# Patient Record
Sex: Male | Born: 2005 | Hispanic: No | Marital: Single | Smoking: Never smoker
Health system: Southern US, Community
[De-identification: ages and names within clinical notes are randomized; demographics above are authoritative.]

## PROBLEM LIST (undated history)

## (undated) DIAGNOSIS — J302 Other seasonal allergic rhinitis: Secondary | ICD-10-CM

## (undated) DIAGNOSIS — J45909 Unspecified asthma, uncomplicated: Secondary | ICD-10-CM

## (undated) HISTORY — PX: ADENOIDECTOMY: SUR15

---
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
## 2023-10-26 ENCOUNTER — Other Ambulatory Visit: Payer: Self-pay

## 2023-10-26 ENCOUNTER — Emergency Department (HOSPITAL_COMMUNITY)
Admission: EM | Admit: 2023-10-26 | Discharge: 2023-10-27 | Disposition: A | Discharge status: Home / Self Care | Attending: Emergency Medicine | Admitting: Emergency Medicine

## 2023-10-26 DIAGNOSIS — E860 Dehydration: Secondary | ICD-10-CM | POA: Insufficient documentation

## 2023-10-26 DIAGNOSIS — R55 Syncope and collapse: Secondary | ICD-10-CM | POA: Insufficient documentation

## 2023-10-26 DIAGNOSIS — R112 Nausea with vomiting, unspecified: Secondary | ICD-10-CM | POA: Insufficient documentation

## 2023-10-26 DIAGNOSIS — R1110 Vomiting, unspecified: Secondary | ICD-10-CM | POA: Diagnosis present

## 2023-10-26 LAB — CBC
HCT: 44.1 % (ref 39.0–52.0)
Hemoglobin: 15.1 g/dL (ref 13.0–17.0)
MCH: 28.8 pg (ref 26.0–34.0)
MCHC: 34.2 g/dL (ref 30.0–36.0)
MCV: 84.2 fL (ref 80.0–100.0)
Platelets: 217 K/uL (ref 150–400)
RBC: 5.24 MIL/uL (ref 4.22–5.81)
RDW: 13.3 % (ref 11.5–15.5)
WBC: 11 K/uL — ABNORMAL HIGH (ref 4.0–10.5)
nRBC: 0 % (ref 0.0–0.2)

## 2023-10-26 LAB — CBG MONITORING, ED: Glucose-Capillary: 99 mg/dL (ref 70–99)

## 2023-10-26 MED ORDER — SODIUM CHLORIDE 0.9 % IV BOLUS
1000.0000 mL | Freq: Once | INTRAVENOUS | Status: AC
  Administered 2023-10-26: 1000 mL via INTRAVENOUS

## 2023-10-26 MED ORDER — ONDANSETRON HCL 4 MG/2ML IJ SOLN
4.0000 mg | Freq: Once | INTRAMUSCULAR | Status: AC | PRN
  Administered 2023-10-26: 4 mg via INTRAVENOUS
  Filled 2023-10-26: qty 2

## 2023-10-26 NOTE — ED Triage Notes (Addendum)
 Pt arrives with c/o sudden tingling in BLE, states he can't move his right hand, this began while he was at home getting ready for work, had just gotten home from the gym. When put in triage room, pt began vomiting, is clammy

## 2023-10-26 NOTE — ED Provider Notes (Signed)
 [facility] EMERGENCY DEPARTMENT AT [facility] Provider Note   CSN: [id number] Arrival date & time: 10/26/23  2245     Patient presents with: Emesis   Steven Reynolds is a 18 y.o. male.  {Add pertinent medical, surgical, social history, OB history to YEP:67052} The history is provided by the patient.  Emesis Steven Reynolds is a 18 y.o. male who presents to the Emergency Department complaining of *** Near syncope, fell to bed.  Went to gym and a few minutes after the gym 1030.  Getting ready for work. Did legs.  Whole body froze, now getting better. Felt numb over entire body.  No fever. Felt sob - tried to take inhaler.  No CP, AP, HA.  Vomited when they arrived.  Has nausea.  No dysuria.  Referred to heart doctor but never went.    No tobacco, rare alcohol. No drugs.   Family hx/o DM, HTN, HPL    Prior to Admission medications   Medication Sig Start Date End Date Taking? Authorizing Provider  acetaminophen  (TYLENOL ) 325 MG tablet Take 2 tablets (650 mg total) by mouth every 6 (six) hours as needed. 11/30/19   Katrinka Raisin, MD  benzonatate  (TESSALON ) 100 MG capsule Take 1 capsule (100 mg total) by mouth every 8 (eight) hours. 01/02/22   Levander Houston, MD  fluticasone  (FLONASE ) 50 MCG/ACT nasal spray Place 2 sprays into both nostrils daily. Patient not taking: Reported on 11/29/2019 06/20/15   Alexandra Erichsen, MD    Allergies: [place] cockroach    Review of Systems  Gastrointestinal:  Positive for vomiting.  All other systems reviewed and are negative.   Updated Vital Signs BP (!) 140/91   Pulse 99   Temp 98.6 F (37 C) (Oral)   Resp 20   SpO2 100%   Physical Exam Vitals and nursing note reviewed.  Constitutional:      Appearance: He is well-developed.  HENT:     Head: Normocephalic and atraumatic.  Cardiovascular:     Rate and Rhythm: Normal rate and regular rhythm.     Heart sounds: No murmur heard. Pulmonary:     Effort: Pulmonary effort is normal.  No respiratory distress.     Breath sounds: Normal breath sounds.  Abdominal:     Palpations: Abdomen is soft.     Tenderness: There is no abdominal tenderness. There is no guarding or rebound.  Musculoskeletal:        General: No swelling or tenderness.  Skin:    General: Skin is warm and dry.  Neurological:     Mental Status: He is alert and oriented to person, place, and time.     Comments: 5/5 strength in all four extremities with sensation to light touch intact in all four extremities.   Psychiatric:        Behavior: Behavior normal.     (all labs ordered are listed, but only abnormal results are displayed) Labs Reviewed  LIPASE, BLOOD  COMPREHENSIVE METABOLIC PANEL WITH GFR  CBC  URINALYSIS, ROUTINE W REFLEX MICROSCOPIC  CK  CBG MONITORING, ED    EKG: EKG Interpretation Date/Time:  Tuesday October 26 2023 23:00:36 EDT Ventricular Rate:  93 PR Interval:  153 QRS Duration:  96 QT Interval:  323 QTC Calculation: 402 R Axis:   78  Text Interpretation: Sinus rhythm Borderline repolarization abnormality Baseline wander in lead(s) II V6 Confirmed by Griselda Norris [phone]) on 10/26/2023 11:16:33 PM  Radiology: No results found.  {Document cardiac monitor, telemetry assessment procedure  when appropriate:32947} Procedures   Medications Ordered in the ED  ondansetron  (ZOFRAN ) injection 4 mg (4 mg Intravenous Given 10/26/23 2327)      {Click here for ABCD2, HEART and other calculators REFRESH Note before signing:1}                              Medical Decision Making Amount and/or Complexity of Data Reviewed Labs: ordered.  Risk Prescription drug management.   ***  {Document critical care time when appropriate  Document review of labs and clinical decision tools ie CHADS2VASC2, etc  Document your independent review of radiology images and any outside records  Document your discussion with family members, caretakers and with consultants  Document social  determinants of health affecting pt's care  Document your decision making why or why not admission, treatments were needed:32947:::1}   Final diagnoses:  None    ED Discharge Orders     None

## 2023-10-27 LAB — COMPREHENSIVE METABOLIC PANEL WITH GFR
ALT: 22 U/L (ref 0–44)
AST: 23 U/L (ref 15–41)
Albumin: 4.5 g/dL (ref 3.5–5.0)
Alkaline Phosphatase: 67 U/L (ref 38–126)
Anion gap: 18 — ABNORMAL HIGH (ref 5–15)
BUN: 18 mg/dL (ref 6–20)
CO2: 21 mmol/L — ABNORMAL LOW (ref 22–32)
Calcium: 9.6 mg/dL (ref 8.9–10.3)
Chloride: 102 mmol/L (ref 98–111)
Creatinine, Ser: 1.02 mg/dL (ref 0.61–1.24)
GFR, Estimated: >60 mL/min (ref >60)
Glucose, Bld: 91 mg/dL (ref 70–99)
Potassium: 3.8 mmol/L (ref 3.5–5.1)
Sodium: 140 mmol/L (ref 135–145)
Total Bilirubin: 0.4 mg/dL (ref 0.0–1.2)
Total Protein: 7.7 g/dL (ref 6.5–8.1)

## 2023-10-27 LAB — LIPASE, BLOOD: Lipase: 53 U/L — ABNORMAL HIGH (ref 11–51)

## 2023-10-27 LAB — CK: Total CK: 224 U/L (ref 49–397)

## 2023-10-27 MED ORDER — ONDANSETRON 4 MG PO TBDP: 4.0000 mg | ORAL_TABLET | Freq: Three times a day (TID) | ORAL | 0 refills | Status: DC | PRN

## 2024-02-29 ENCOUNTER — Other Ambulatory Visit: Payer: Self-pay

## 2024-02-29 ENCOUNTER — Emergency Department (HOSPITAL_BASED_OUTPATIENT_CLINIC_OR_DEPARTMENT_OTHER)

## 2024-02-29 ENCOUNTER — Emergency Department (HOSPITAL_BASED_OUTPATIENT_CLINIC_OR_DEPARTMENT_OTHER)
Admission: EM | Admit: 2024-02-29 | Discharge: 2024-02-29 | Disposition: A | Discharge status: Home / Self Care | Attending: Emergency Medicine | Admitting: Emergency Medicine

## 2024-02-29 ENCOUNTER — Encounter (HOSPITAL_BASED_OUTPATIENT_CLINIC_OR_DEPARTMENT_OTHER): Payer: Self-pay | Admitting: Emergency Medicine

## 2024-02-29 DIAGNOSIS — R509 Fever, unspecified: Secondary | ICD-10-CM | POA: Diagnosis present

## 2024-02-29 DIAGNOSIS — J45909 Unspecified asthma, uncomplicated: Secondary | ICD-10-CM | POA: Diagnosis not present

## 2024-02-29 DIAGNOSIS — R748 Abnormal levels of other serum enzymes: Secondary | ICD-10-CM | POA: Diagnosis not present

## 2024-02-29 DIAGNOSIS — R1010 Upper abdominal pain, unspecified: Secondary | ICD-10-CM | POA: Insufficient documentation

## 2024-02-29 DIAGNOSIS — J101 Influenza due to other identified influenza virus with other respiratory manifestations: Secondary | ICD-10-CM | POA: Insufficient documentation

## 2024-02-29 LAB — COMPREHENSIVE METABOLIC PANEL WITH GFR
ALT: 47 U/L — ABNORMAL HIGH (ref 0–44)
AST: 32 U/L (ref 15–41)
Albumin: 4.8 g/dL (ref 3.5–5.0)
Alkaline Phosphatase: 53 U/L (ref 38–126)
Anion gap: 14 (ref 5–15)
BUN: 15 mg/dL (ref 6–20)
CO2: 21 mmol/L — ABNORMAL LOW (ref 22–32)
Calcium: 9.1 mg/dL (ref 8.9–10.3)
Chloride: 102 mmol/L (ref 98–111)
Creatinine, Ser: 1.1 mg/dL (ref 0.61–1.24)
GFR, Estimated: >60 mL/min
Glucose, Bld: 105 mg/dL — ABNORMAL HIGH (ref 70–99)
Potassium: 3.9 mmol/L (ref 3.5–5.1)
Sodium: 137 mmol/L (ref 135–145)
Total Bilirubin: 0.4 mg/dL (ref 0.0–1.2)
Total Protein: 8.1 g/dL (ref 6.5–8.1)

## 2024-02-29 LAB — URINALYSIS, ROUTINE W REFLEX MICROSCOPIC
Bilirubin Urine: NEGATIVE
Glucose, UA: NEGATIVE mg/dL
Hgb urine dipstick: NEGATIVE
Ketones, ur: NEGATIVE mg/dL
Leukocytes,Ua: NEGATIVE
Nitrite: NEGATIVE
Protein, ur: 100 mg/dL — AB
Specific Gravity, Urine: 1.025 (ref 1.005–1.030)
pH: 7 (ref 5.0–8.0)

## 2024-02-29 LAB — RESP PANEL BY RT-PCR (RSV, FLU A&B, COVID)  RVPGX2
Influenza A by PCR: POSITIVE — AB
Influenza B by PCR: NEGATIVE
Resp Syncytial Virus by PCR: NEGATIVE
SARS Coronavirus 2 by RT PCR: NEGATIVE

## 2024-02-29 LAB — URINALYSIS, MICROSCOPIC (REFLEX)

## 2024-02-29 LAB — CBC
HCT: 44.8 % (ref 39.0–52.0)
Hemoglobin: 15.7 g/dL (ref 13.0–17.0)
MCH: 29.7 pg (ref 26.0–34.0)
MCHC: 35 g/dL (ref 30.0–36.0)
MCV: 84.7 fL (ref 80.0–100.0)
Platelets: 173 K/uL (ref 150–400)
RBC: 5.29 MIL/uL (ref 4.22–5.81)
RDW: 13.7 % (ref 11.5–15.5)
WBC: 7.7 K/uL (ref 4.0–10.5)
nRBC: 0 % (ref 0.0–0.2)

## 2024-02-29 LAB — LIPASE, BLOOD: Lipase: 124 U/L — ABNORMAL HIGH (ref 11–51)

## 2024-02-29 MED ORDER — GUAIFENESIN-CODEINE 100-10 MG/5ML PO SOLN: 5.0000 mL | Freq: Three times a day (TID) | ORAL | 0 refills | Status: AC | PRN

## 2024-02-29 MED ORDER — ONDANSETRON 4 MG PO TBDP
4.0000 mg | ORAL_TABLET | Freq: Once | ORAL | Status: AC | PRN
  Administered 2024-02-29: 4 mg via ORAL
  Filled 2024-02-29: qty 1

## 2024-02-29 MED ORDER — OSELTAMIVIR PHOSPHATE 75 MG PO CAPS: 75.0000 mg | ORAL_CAPSULE | Freq: Two times a day (BID) | ORAL | 0 refills | Status: AC

## 2024-02-29 MED ORDER — ONDANSETRON 4 MG PO TBDP: 4.0000 mg | ORAL_TABLET | Freq: Three times a day (TID) | ORAL | 0 refills | Status: AC | PRN

## 2024-02-29 MED ORDER — ACETAMINOPHEN 325 MG PO TABS
650.0000 mg | ORAL_TABLET | Freq: Once | ORAL | Status: AC
  Administered 2024-02-29: 650 mg via ORAL
  Filled 2024-02-29: qty 2

## 2024-02-29 MED ORDER — GUAIFENESIN-CODEINE 100-10 MG/5ML PO SOLN
10.0000 mL | Freq: Once | ORAL | Status: AC
  Administered 2024-02-29: 10 mL via ORAL
  Filled 2024-02-29: qty 10

## 2024-02-29 NOTE — ED Notes (Signed)
 Patient transported to X-ray

## 2024-02-29 NOTE — ED Provider Notes (Signed)
 " [place] [facility] AT [facility] Provider Note   CSN: [id number] Arrival date & time: 02/29/24  8065     Patient presents with: Fever, Shortness of Breath, and Abdominal Pain   Steven Reynolds is a 18 y.o. male.   Patient with noncontributory past medical history presents today with complaints of cough and congestion. Reports that symptoms began yesterday afternoon and has been persistent since then. Reports that he has been taking dayquil and nyquil with minimal improvement. Today he noted that he was having headaches, was breaking out in hot sweats, persistent uncontrolled coughing with pain in his upper abdomen when he is coughing. Also reports that he has had some nausea and 1 episode of vomiting today. Reports he feels like he is having trouble catching his breath due to persistent coughing. Denies diarrhea. Some mild soreness in his abdomen when he is not coughing but no significant pain. No chest pain. Does report people at work have been sick with the flu.  Denies recent travel or surgeries, no leg pain or leg swelling.  The history is provided by the patient. No language interpreter was used.  Fever Associated symptoms: congestion, cough, nausea and vomiting (currently resolved)   Shortness of Breath Associated symptoms: abdominal pain, cough, fever and vomiting (currently resolved)   Abdominal Pain Associated symptoms: cough, fever, nausea, shortness of breath and vomiting (currently resolved)        Prior to Admission medications  Medication Sig Start Date End Date Taking? Authorizing Provider  acetaminophen  (TYLENOL ) 325 MG tablet Take 2 tablets (650 mg total) by mouth every 6 (six) hours as needed. 11/30/19   Katrinka Raisin, MD  benzonatate  (TESSALON ) 100 MG capsule Take 1 capsule (100 mg total) by mouth every 8 (eight) hours. 01/02/22   Levander Houston, MD  fluticasone  (FLONASE ) 50 MCG/ACT nasal spray Place 2 sprays into both nostrils daily. Patient  not taking: Reported on 11/29/2019 06/20/15   Alexandra Erichsen, MD  ondansetron  (ZOFRAN -ODT) 4 MG disintegrating tablet Take 1 tablet (4 mg total) by mouth every 8 (eight) hours as needed. 10/27/23   Griselda Norris, MD    Allergies: German cockroach    Review of Systems  Constitutional:  Positive for fever.  HENT:  Positive for congestion.   Respiratory:  Positive for cough and shortness of breath.   Gastrointestinal:  Positive for abdominal pain, nausea and vomiting (currently resolved).  All other systems reviewed and are negative.   Updated Vital Signs BP (!) 155/107 (BP Location: Left Arm)   Pulse (!) 113   Temp 99.7 F (37.6 C) (Oral)   Resp 20   Ht 6' 2 (1.88 m)   Wt (!) 142.9 kg   SpO2 96%   BMI 40.44 kg/m   Physical Exam Vitals and nursing note reviewed.  Constitutional:      General: He is not in acute distress.    Appearance: Normal appearance. He is normal weight. He is not ill-appearing, toxic-appearing or diaphoretic.  HENT:     Head: Normocephalic and atraumatic.  Cardiovascular:     Rate and Rhythm: Normal rate and regular rhythm.     Heart sounds: Normal heart sounds.  Pulmonary:     Effort: Pulmonary effort is normal. No respiratory distress.     Breath sounds: Normal breath sounds. No decreased breath sounds, wheezing, rhonchi or rales.  Abdominal:     General: Abdomen is flat.     Palpations: Abdomen is soft.     Tenderness: There is  no abdominal tenderness.  Musculoskeletal:        General: Normal range of motion.     Cervical back: Normal range of motion and neck supple.     Right lower leg: No tenderness. No edema.     Left lower leg: No tenderness. No edema.  Skin:    General: Skin is warm and dry.  Neurological:     General: No focal deficit present.     Mental Status: He is alert.  Psychiatric:        Mood and Affect: Mood normal.        Behavior: Behavior normal.     (all labs ordered are listed, but only abnormal results are  displayed) Labs Reviewed  RESP PANEL BY RT-PCR (RSV, FLU A&B, COVID)  RVPGX2 - Abnormal; Notable for the following components:      Result Value   Influenza A by PCR POSITIVE (*)    All other components within normal limits  LIPASE, BLOOD - Abnormal; Notable for the following components:   Lipase 124 (*)    All other components within normal limits  COMPREHENSIVE METABOLIC PANEL WITH GFR - Abnormal; Notable for the following components:   CO2 21 (*)    Glucose, Bld 105 (*)    ALT 47 (*)    All other components within normal limits  URINALYSIS, ROUTINE W REFLEX MICROSCOPIC - Abnormal; Notable for the following components:   APPearance CLOUDY (*)    Protein, ur 100 (*)    All other components within normal limits  URINALYSIS, MICROSCOPIC (REFLEX) - Abnormal; Notable for the following components:   Bacteria, UA RARE (*)    All other components within normal limits  CBC    EKG: None  Radiology: DG Chest 2 View Result Date: 02/29/2024 CLINICAL DATA:  Shortness of breath.  Fever.  Body aches. EXAM: DG CHEST 2V COMPARISON:  01/29/2022 FINDINGS: The cardiomediastinal contours are normal. Mild bronchial thickening. Pulmonary vasculature is normal. No consolidation, pleural effusion, or pneumothorax. No acute osseous abnormalities are seen. IMPRESSION: Mild bronchial thickening. Electronically Signed   By: Andrea Gasman M.D.   On: 02/29/2024 23:11     Procedures   Medications Ordered in the ED  ondansetron  (ZOFRAN -ODT) disintegrating tablet 4 mg (4 mg Oral Given 02/29/24 1950)  acetaminophen  (TYLENOL ) tablet 650 mg (650 mg Oral Given 02/29/24 1950)  guaiFENesin -codeine  100-10 MG/5ML solution 10 mL (10 mLs Oral Given 02/29/24 2140)                                    Medical Decision Making Amount and/or Complexity of Data Reviewed Labs: ordered. Radiology: ordered.  Risk OTC drugs. Prescription drug management.   This patient is a 18 y.o. male who presents to the ED for  concern of cough, congestion, this involves an extensive number of treatment options, and is a complaint that carries with it a high risk of complications and morbidity. The emergent differential diagnosis prior to evaluation includes, but is not limited to,  URI, pneumonia, sepsis, PE,  PUD, gastritis, pancreatitis, gastroparesis, malignancy, biliary disease, ACS, pericarditis, intestinal ischemia, esophageal rupture, hepatitis  This is not an exhaustive differential.   Past Medical History / Co-morbidities / Social History:  has a past medical history of Asthma and Seasonal allergies.  Patient reports asthma has not been an issue since childhood  Additional history: Chart reviewed.  Physical Exam: Physical exam performed. The  pertinent findings include: Well-appearing, lung sounds clear to auscultation, abdomen soft and nontender.  Lab Tests: I ordered, and personally interpreted labs.  The pertinent results include: No leukocytosis, lipase 124, influenza A positive   Imaging Studies: I ordered imaging studies including CXR. I independently visualized and interpreted imaging which showed Mild bronchial thickening . I agree with the radiologist interpretation.   Cardiac Monitoring:  The patient was maintained on a cardiac monitor.  My attending physician viewed and interpreted the cardiac monitored which showed an underlying rhythm of: sinus rhythm, no STEMI. I agree with this interpretation.   Medications: I ordered medication including tylenol , zofran , guaifenesin -codeine  cough syrup  for nausea, cough. Reevaluation of the patient after these medicines showed that the patient improved. I have reviewed the patients home medicines and have made adjustments as needed.   Disposition: After consideration of the diagnostic results and the patients response to treatment, I feel that emergency department workup does not suggest an emergent condition requiring admission or immediate  intervention beyond what has been performed at this time. The plan is: discharge with tamiflu given flu A+, zofran , and codeine  cough syrup for symptoms, close outpatient follow-up and return precautions. Patient is flu positive, symptoms likely related to same. He does have an elevated lipase and did mention some abdominal pain, however his abdomen is soft and nontender, states that the pain is really only present with coughing. He is no longer vomiting, tolerates po without residual symptoms. He does not drink alcohol, has no risk factors for pancreatitis.  Did discuss these results with the patient and offered CT imaging, he declines this, would prefer to go home and rest and return if symptoms gets worse. He did also note some shortness of breath, however he is able to walk to the bathroom without dyspnea and returns to bed with normal oxygen saturation, given flu positive and no risk factors, low suspicion for ACS/PE. Again, shared decision making, patient does not want to stay for further workup and wants to return home to rest.  He did receive codeine  cough syrup here and states that it helped him a lot, I have given a prescription for this.  Advised not to drive or operate machinery while taking this medication.  PDMP reviewed.  Has had less than 48 hours of symptoms as well, will prescribe Tamiflu. Evaluation and diagnostic testing in the emergency department does not suggest an emergent condition requiring admission or immediate intervention beyond what has been performed at this time.  Plan for discharge with close PCP follow-up.  Patient is understanding and amenable with plan, educated on red flag symptoms that would prompt immediate return.  Patient discharged in stable condition.  Final diagnoses:  Influenza A  Elevated lipase    ED Discharge Orders          Ordered    ondansetron  (ZOFRAN -ODT) 4 MG disintegrating tablet  Every 8 hours PRN        02/29/24 2338    guaiFENesin -codeine  100-10  MG/5ML syrup  3 times daily PRN        02/29/24 2338    oseltamivir (TAMIFLU) 75 MG capsule  Every 12 hours        02/29/24 2338          An After Visit Summary was printed and given to the patient.      Nora Lauraine DELENA DEVONNA 02/29/24 2341  "

## 2024-02-29 NOTE — ED Notes (Signed)
..  The patient is A&OX4, ambulatory at d/c with independent steady gait, NAD. Pt verbalized understanding of d/c instructions, prescriptions and follow up care.

## 2024-02-29 NOTE — ED Triage Notes (Signed)
 Pt c/o fever, abd pain, body aches x 1 day. Also reports sore throat.   + nausea. Emesis x 1 this AM.   RT to triage to assess.

## 2024-02-29 NOTE — Discharge Instructions (Signed)
 As we discussed, you tested positive for the flu today.  This is likely the cause of your symptoms.  As this is a viral illness, no antibiotics are indicated.  I recommend that you get plenty of rest and focus on symptomatic relief which includes Cepacol throat lozenges for sore throat, Mucinex  for congestion, and tylenol /ibuprofen  as needed for fevers and bodyaches. I have also given you a prescription for Zofran , Tamiflu, and codeine  cough syrup.  The Zofran  is for any residual nausea/vomiting.  The Tamiflu is an antiviral medicine that has shown some success with reducing duration of symptoms from the flu.  Take this as prescribed.  It does sometimes cause stomach upset, if it does this then I recommend discontinuing this medicine.  I have also given you the codeine  cough syrup which you can take as prescribed as needed.  This is a narcotic medicine and therefore you need to be cautious with taking it and do not drive, work, or operate heavy machinery while taking this medicine as it can make you drowsy.  I also recommend:  Increased fluid intake. Sports drinks offer valuable electrolytes, sugars, and fluids.  Breathing heated mist or steam (vaporizer or shower).  Eating chicken soup or other clear broths, and maintaining good nutrition.   Increasing usage of your inhaler if you have asthma.  Return to work when your temperature has returned to normal.  Gargle warm salt water and spit it out for sore throat. Take benadryl or Zyrtec to decrease sinus secretions.   Additionally, your lipase was slightly elevated today, this is a pancreatic enzyme.  We did discuss further evaluating this, however you would prefer to go home and rest which is reasonable.  Please call your primary care provider to schedule a close follow-up appointment to further discuss this and return if development of any new or worsening symptoms.  Follow Up: Follow up with your primary care doctor in 5-7 days for recheck of ongoing  symptoms.  Return to emergency department for emergent changing or worsening of symptoms.
